# Patient Record
Sex: Male | Born: 1980
Health system: Southern US, Community
[De-identification: ages and names within clinical notes are randomized; demographics above are authoritative.]

---

## 2020-06-17 ENCOUNTER — Emergency Department (HOSPITAL_COMMUNITY): Payer: Self-pay

## 2020-06-17 ENCOUNTER — Observation Stay (HOSPITAL_COMMUNITY)
Admission: EM | Admit: 2020-06-17 | Discharge: 2020-06-18 | Disposition: A | Discharge status: Home / Self Care | Payer: Self-pay | Attending: General Surgery | Admitting: General Surgery

## 2020-06-17 ENCOUNTER — Encounter (HOSPITAL_COMMUNITY): Payer: Self-pay | Admitting: Emergency Medicine

## 2020-06-17 DIAGNOSIS — Z23 Encounter for immunization: Secondary | ICD-10-CM | POA: Insufficient documentation

## 2020-06-17 DIAGNOSIS — W3400XA Accidental discharge from unspecified firearms or gun, initial encounter: Secondary | ICD-10-CM

## 2020-06-17 DIAGNOSIS — S2241XB Multiple fractures of ribs, right side, initial encounter for open fracture: Principal | ICD-10-CM | POA: Insufficient documentation

## 2020-06-17 DIAGNOSIS — F17200 Nicotine dependence, unspecified, uncomplicated: Secondary | ICD-10-CM | POA: Insufficient documentation

## 2020-06-17 DIAGNOSIS — S27321A Contusion of lung, unilateral, initial encounter: Secondary | ICD-10-CM

## 2020-06-17 DIAGNOSIS — S21231A Puncture wound without foreign body of right back wall of thorax without penetration into thoracic cavity, initial encounter: Secondary | ICD-10-CM

## 2020-06-17 DIAGNOSIS — S21201A Unspecified open wound of right back wall of thorax without penetration into thoracic cavity, initial encounter: Secondary | ICD-10-CM | POA: Insufficient documentation

## 2020-06-17 DIAGNOSIS — Z20822 Contact with and (suspected) exposure to covid-19: Secondary | ICD-10-CM | POA: Insufficient documentation

## 2020-06-17 DIAGNOSIS — S2231XA Fracture of one rib, right side, initial encounter for closed fracture: Secondary | ICD-10-CM

## 2020-06-17 LAB — CBC
HCT: 43.6 % (ref 39.0–52.0)
Hemoglobin: 14 g/dL (ref 13.0–17.0)
MCH: 29.7 pg (ref 26.0–34.0)
MCHC: 32.1 g/dL (ref 30.0–36.0)
MCV: 92.4 fL (ref 80.0–100.0)
Platelets: 333 10*3/uL (ref 150–400)
RBC: 4.72 MIL/uL (ref 4.22–5.81)
RDW: 13.1 % (ref 11.5–15.5)
WBC: 7.3 10*3/uL (ref 4.0–10.5)
nRBC: 0 % (ref 0.0–0.2)

## 2020-06-17 LAB — COMPREHENSIVE METABOLIC PANEL
ALT: 56 U/L — ABNORMAL HIGH (ref 0–44)
AST: 33 U/L (ref 15–41)
Albumin: 3.6 g/dL (ref 3.5–5.0)
Alkaline Phosphatase: 68 U/L (ref 38–126)
Anion gap: 10 (ref 5–15)
BUN: 11 mg/dL (ref 6–20)
CO2: 23 mmol/L (ref 22–32)
Calcium: 8.7 mg/dL — ABNORMAL LOW (ref 8.9–10.3)
Chloride: 107 mmol/L (ref 98–111)
Creatinine, Ser: 1.19 mg/dL (ref 0.61–1.24)
GFR, Estimated: >60 mL/min (ref >60)
Glucose, Bld: 111 mg/dL — ABNORMAL HIGH (ref 70–99)
Potassium: 3.8 mmol/L (ref 3.5–5.1)
Sodium: 140 mmol/L (ref 135–145)
Total Bilirubin: 0.6 mg/dL (ref 0.3–1.2)
Total Protein: 6.9 g/dL (ref 6.5–8.1)

## 2020-06-17 LAB — SAMPLE TO BLOOD BANK

## 2020-06-17 LAB — RESP PANEL BY RT-PCR (FLU A&B, COVID) ARPGX2
Influenza A by PCR: NEGATIVE
Influenza B by PCR: NEGATIVE
SARS Coronavirus 2 by RT PCR: NEGATIVE

## 2020-06-17 LAB — HIV ANTIBODY (ROUTINE TESTING W REFLEX): HIV Screen 4th Generation wRfx: NONREACTIVE

## 2020-06-17 LAB — I-STAT CHEM 8, ED
BUN: 14 mg/dL (ref 6–20)
Calcium, Ion: 1.07 mmol/L — ABNORMAL LOW (ref 1.15–1.40)
Chloride: 106 mmol/L (ref 98–111)
Creatinine, Ser: 1 mg/dL (ref 0.61–1.24)
Glucose, Bld: 108 mg/dL — ABNORMAL HIGH (ref 70–99)
HCT: 43 % (ref 39.0–52.0)
Hemoglobin: 14.6 g/dL (ref 13.0–17.0)
Potassium: 3.7 mmol/L (ref 3.5–5.1)
Sodium: 142 mmol/L (ref 135–145)
TCO2: 24 mmol/L (ref 22–32)

## 2020-06-17 LAB — ETHANOL: Alcohol, Ethyl (B): <10 mg/dL (ref <10)

## 2020-06-17 LAB — LACTIC ACID, PLASMA: Lactic Acid, Venous: 1.2 mmol/L (ref 0.5–1.9)

## 2020-06-17 LAB — PROTIME-INR
INR: 1.1 (ref 0.8–1.2)
Prothrombin Time: 14.1 seconds (ref 11.4–15.2)

## 2020-06-17 MED ORDER — FENTANYL CITRATE (PF) 100 MCG/2ML IJ SOLN
50.0000 ug | Freq: Once | INTRAMUSCULAR | Status: AC
  Administered 2020-06-17: 50 ug via INTRAVENOUS
  Filled 2020-06-17: qty 2

## 2020-06-17 MED ORDER — OXYCODONE HCL 5 MG PO TABS: 5.0000 mg | ORAL_TABLET | ORAL | Status: DC | PRN

## 2020-06-17 MED ORDER — IOHEXOL 300 MG/ML  SOLN
75.0000 mL | Freq: Once | INTRAMUSCULAR | Status: AC | PRN
  Administered 2020-06-17: 75 mL via INTRAVENOUS

## 2020-06-17 MED ORDER — TETANUS-DIPHTH-ACELL PERTUSSIS 5-2.5-18.5 LF-MCG/0.5 IM SUSY
0.5000 mL | PREFILLED_SYRINGE | Freq: Once | INTRAMUSCULAR | Status: AC
  Administered 2020-06-17: 0.5 mL via INTRAMUSCULAR
  Filled 2020-06-17: qty 0.5

## 2020-06-17 MED ORDER — OXYCODONE HCL 5 MG PO TABS
10.0000 mg | ORAL_TABLET | ORAL | Status: DC | PRN
  Administered 2020-06-17: 10 mg via ORAL
  Filled 2020-06-17: qty 2

## 2020-06-17 MED ORDER — HYDROMORPHONE HCL 1 MG/ML IJ SOLN
1.0000 mg | INTRAMUSCULAR | Status: DC | PRN
  Administered 2020-06-17 – 2020-06-18 (×4): 1 mg via INTRAVENOUS
  Filled 2020-06-17 (×4): qty 1

## 2020-06-17 MED ORDER — ONDANSETRON HCL 4 MG/2ML IJ SOLN: 4.0000 mg | Freq: Four times a day (QID) | INTRAMUSCULAR | Status: DC | PRN

## 2020-06-17 MED ORDER — ENOXAPARIN SODIUM 30 MG/0.3ML ~~LOC~~ SOLN
30.0000 mg | Freq: Two times a day (BID) | SUBCUTANEOUS | Status: DC
  Administered 2020-06-18: 30 mg via SUBCUTANEOUS
  Filled 2020-06-17: qty 0.3

## 2020-06-17 MED ORDER — ONDANSETRON 4 MG PO TBDP: 4.0000 mg | ORAL_TABLET | Freq: Four times a day (QID) | ORAL | Status: DC | PRN

## 2020-06-17 MED ORDER — FENTANYL CITRATE (PF) 100 MCG/2ML IJ SOLN
INTRAMUSCULAR | Status: AC
  Administered 2020-06-17: 50 ug
  Filled 2020-06-17: qty 2

## 2020-06-17 MED ORDER — METHOCARBAMOL 750 MG PO TABS
750.0000 mg | ORAL_TABLET | Freq: Three times a day (TID) | ORAL | Status: DC
  Administered 2020-06-17 – 2020-06-18 (×3): 750 mg via ORAL
  Filled 2020-06-17 (×4): qty 1

## 2020-06-17 MED ORDER — GABAPENTIN 100 MG PO CAPS
100.0000 mg | ORAL_CAPSULE | Freq: Three times a day (TID) | ORAL | Status: DC
  Administered 2020-06-17 – 2020-06-18 (×3): 100 mg via ORAL
  Filled 2020-06-17 (×3): qty 1

## 2020-06-17 MED ORDER — POTASSIUM CHLORIDE IN NACL 20-0.9 MEQ/L-% IV SOLN
INTRAVENOUS | Status: DC
  Filled 2020-06-17: qty 1000

## 2020-06-17 MED ORDER — ACETAMINOPHEN 325 MG PO TABS: 650.0000 mg | ORAL_TABLET | ORAL | Status: DC | PRN
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
## 2020-06-18 ENCOUNTER — Other Ambulatory Visit (HOSPITAL_COMMUNITY): Payer: Self-pay | Admitting: General Surgery

## 2020-06-18 ENCOUNTER — Observation Stay (HOSPITAL_COMMUNITY): Payer: Self-pay

## 2020-06-18 MED ORDER — GUAIFENESIN ER 600 MG PO TB12
600.0000 mg | ORAL_TABLET | Freq: Two times a day (BID) | ORAL | Status: DC | PRN
Start: 2020-06-18 — End: 2020-06-18

## 2020-06-18 MED ORDER — GUAIFENESIN ER 600 MG PO TB12: 600.0000 mg | ORAL_TABLET | Freq: Two times a day (BID) | ORAL | Status: AC | PRN

## 2020-06-18 MED ORDER — IBUPROFEN 200 MG PO TABS: 600.0000 mg | ORAL_TABLET | Freq: Four times a day (QID) | ORAL | Status: AC | PRN

## 2020-06-18 MED ORDER — OXYCODONE HCL 5 MG PO TABS
5.0000 mg | ORAL_TABLET | Freq: Four times a day (QID) | ORAL | 0 refills | Status: AC | PRN
Start: 2020-06-18 — End: ?

## 2020-06-18 MED ORDER — OXYCODONE HCL 5 MG PO TABS
5.0000 mg | ORAL_TABLET | Freq: Four times a day (QID) | ORAL | 0 refills | Status: DC | PRN
Start: 2020-06-18 — End: 2020-06-18

## 2020-06-18 MED ORDER — METHOCARBAMOL 500 MG PO TABS: 1000.0000 mg | ORAL_TABLET | Freq: Three times a day (TID) | ORAL | Status: DC

## 2020-06-18 MED ORDER — ACETAMINOPHEN 500 MG PO TABS
1000.0000 mg | ORAL_TABLET | Freq: Three times a day (TID) | ORAL | Status: DC
  Administered 2020-06-18: 1000 mg via ORAL
  Filled 2020-06-18: qty 2

## 2020-06-18 MED ORDER — IBUPROFEN 200 MG PO TABS
600.0000 mg | ORAL_TABLET | Freq: Four times a day (QID) | ORAL | Status: DC | PRN
Start: 2020-06-18 — End: 2020-06-18

## 2020-06-18 MED ORDER — METHOCARBAMOL 500 MG PO TABS: 1000.0000 mg | ORAL_TABLET | Freq: Three times a day (TID) | ORAL | 0 refills | Status: AC

## 2020-06-18 MED ORDER — GABAPENTIN 100 MG PO CAPS
100.0000 mg | ORAL_CAPSULE | Freq: Three times a day (TID) | ORAL | 0 refills | Status: DC
Start: 2020-06-18 — End: 2020-06-18

## 2020-06-18 MED ORDER — BACITRACIN ZINC 500 UNIT/GM EX OINT: TOPICAL_OINTMENT | Freq: Two times a day (BID) | CUTANEOUS | 0 refills | Status: AC

## 2020-06-18 MED ORDER — IBUPROFEN 600 MG PO TABS
600.0000 mg | ORAL_TABLET | Freq: Four times a day (QID) | ORAL | Status: DC
  Administered 2020-06-18: 600 mg via ORAL
  Filled 2020-06-18: qty 1

## 2020-06-18 MED ORDER — GUAIFENESIN ER 600 MG PO TB12: 600.0000 mg | ORAL_TABLET | Freq: Two times a day (BID) | ORAL | Status: DC | PRN

## 2020-06-18 MED ORDER — ACETAMINOPHEN 500 MG PO TABS: 1000.0000 mg | ORAL_TABLET | Freq: Three times a day (TID) | ORAL | Status: AC | PRN

## 2020-06-18 MED ORDER — GABAPENTIN 100 MG PO CAPS: 100.0000 mg | ORAL_CAPSULE | Freq: Three times a day (TID) | ORAL | 0 refills | Status: AC

## 2020-06-18 MED ORDER — BACITRACIN ZINC 500 UNIT/GM EX OINT
TOPICAL_OINTMENT | Freq: Two times a day (BID) | CUTANEOUS | Status: DC
  Filled 2020-06-18: qty 28.35

## 2020-06-18 MED ORDER — ACETAMINOPHEN 500 MG PO TABS
1000.0000 mg | ORAL_TABLET | Freq: Three times a day (TID) | ORAL | Status: DC | PRN
Start: 2020-06-18 — End: 2020-06-18

## 2020-06-18 MED ORDER — BACITRACIN ZINC 500 UNIT/GM EX OINT
TOPICAL_OINTMENT | Freq: Two times a day (BID) | CUTANEOUS | 0 refills | Status: DC
Start: 2020-06-18 — End: 2020-06-18

## 2020-06-18 MED ORDER — METHOCARBAMOL 500 MG PO TABS
1000.0000 mg | ORAL_TABLET | Freq: Three times a day (TID) | ORAL | 0 refills | Status: DC
Start: 2020-06-18 — End: 2020-06-18

## 2020-06-18 MED FILL — GABAPENTIN 100 MG CAPSULE: 100 | 30 days supply | Qty: 90 | Fill #0

## 2020-06-18 MED FILL — METHOCARBAMOL 500 MG TABS: 500 | 10 days supply | Qty: 60 | Fill #0

## 2020-06-18 MED FILL — oxyCODONE HCL 5 MG TABS: 5 | 2 days supply | Qty: 15 | Fill #0
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
## 2020-06-18 NOTE — Progress Notes (Signed)
°   06/18/20 0955  Assess: MEWS Score  Temp (!) 100.8 F (38.2 C)  BP 122/62  Pulse Rate (!) 108  Resp 18  SpO2 95 %  O2 Device Room Air  Assess: MEWS Score  MEWS Temp 1  MEWS Systolic 0  MEWS Pulse 1  MEWS RR 0  MEWS LOC 0  MEWS Score 2  MEWS Score Color Yellow  Assess: if the MEWS score is Yellow or Red  Were vital signs taken at a resting state? Yes  Focused Assessment No change from prior assessment  Early Detection of Sepsis Score *See Row Information* Low  MEWS guidelines implemented *See Row Information* Yes  Treat  MEWS Interventions Administered scheduled meds/treatments  Pain Scale 0-10  Pain Score 7  Pain Type Acute pain  Pain Location Chest  Pain Orientation Right  Take Vital Signs  Increase Vital Sign Frequency  Yellow: Q 2hr X 2 then Q 4hr X 2, if remains yellow, continue Q 4hrs  Escalate  MEWS: Escalate Yellow: discuss with charge nurse/RN and consider discussing with provider and RRT  Notify: Charge Nurse/RN  Name of Charge Nurse/RN Notified Kristie, RN  Date Charge Nurse/RN Notified 06/18/20  Time Charge Nurse/RN Notified 1100  Notify: Provider  Date Provider Notified 06/18/20  Time Provider Notified 1130  Notification Type Page  Notification Reason Change in status  Response No new orders  Date of Provider Response 06/18/20

## 2020-06-18 NOTE — Progress Notes (Signed)
PT Cancellation Note  Patient Details Name: Austin [name] MRN: [id number] DOB: 06-27-1981   Cancelled Treatment:    Reason Eval/Treat Not Completed: Patient declined, no reason specified   Declined PT eval, doesn't want to move;  Noted for dc;  Was sitting EOB without difficulty, pt indicated getting up was hard, but he did it; Declined practicing when this PT offered;   Van Clines, PT  Acute Rehabilitation Services Pager [phone] Office [phone]      Levi Aland 06/18/2020, 1:48 PM
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
## 2022-01-19 ENCOUNTER — Emergency Department (HOSPITAL_COMMUNITY): Payer: Self-pay

## 2022-01-19 ENCOUNTER — Emergency Department (HOSPITAL_COMMUNITY)
Admission: EM | Admit: 2022-01-19 | Discharge: 2022-01-20 | Disposition: A | Discharge status: Home / Self Care | Payer: Self-pay | Attending: Emergency Medicine | Admitting: Emergency Medicine

## 2022-01-19 DIAGNOSIS — R4182 Altered mental status, unspecified: Secondary | ICD-10-CM | POA: Insufficient documentation

## 2022-01-19 DIAGNOSIS — T50904A Poisoning by unspecified drugs, medicaments and biological substances, undetermined, initial encounter: Secondary | ICD-10-CM | POA: Insufficient documentation

## 2022-01-19 MED ORDER — SODIUM CHLORIDE 0.9 % IV BOLUS
1000.0000 mL | Freq: Once | INTRAVENOUS | Status: AC
  Administered 2022-01-20: 1000 mL via INTRAVENOUS
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
## 2022-01-20 LAB — CBC
HCT: 37.4 % — ABNORMAL LOW (ref 39.0–52.0)
Hemoglobin: 12.8 g/dL — ABNORMAL LOW (ref 13.0–17.0)
MCH: 30.3 pg (ref 26.0–34.0)
MCHC: 34.2 g/dL (ref 30.0–36.0)
MCV: 88.4 fL (ref 80.0–100.0)
Platelets: 288 10*3/uL (ref 150–400)
RBC: 4.23 MIL/uL (ref 4.22–5.81)
RDW: 12.6 % (ref 11.5–15.5)
WBC: 5.9 10*3/uL (ref 4.0–10.5)
nRBC: 0 % (ref 0.0–0.2)

## 2022-01-20 LAB — COMPREHENSIVE METABOLIC PANEL
ALT: 34 U/L (ref 0–44)
AST: 24 U/L (ref 15–41)
Albumin: 3.6 g/dL (ref 3.5–5.0)
Alkaline Phosphatase: 70 U/L (ref 38–126)
Anion gap: 12 (ref 5–15)
BUN: 13 mg/dL (ref 6–20)
CO2: 22 mmol/L (ref 22–32)
Calcium: 8.6 mg/dL — ABNORMAL LOW (ref 8.9–10.3)
Chloride: 103 mmol/L (ref 98–111)
Creatinine, Ser: 1.13 mg/dL (ref 0.61–1.24)
GFR, Estimated: >60 mL/min (ref >60)
Glucose, Bld: 206 mg/dL — ABNORMAL HIGH (ref 70–99)
Potassium: 3.1 mmol/L — ABNORMAL LOW (ref 3.5–5.1)
Sodium: 137 mmol/L (ref 135–145)
Total Bilirubin: 0.7 mg/dL (ref 0.3–1.2)
Total Protein: 6.5 g/dL (ref 6.5–8.1)

## 2022-01-20 LAB — PROTIME-INR
INR: 1.1 (ref 0.8–1.2)
Prothrombin Time: 14.2 seconds (ref 11.4–15.2)

## 2022-01-20 LAB — LACTIC ACID, PLASMA: Lactic Acid, Venous: 1.6 mmol/L (ref 0.5–1.9)

## 2022-01-20 LAB — ACETAMINOPHEN LEVEL: Acetaminophen (Tylenol), Serum: <10 ug/mL — ABNORMAL LOW (ref 10–30)

## 2022-01-20 LAB — ETHANOL: Alcohol, Ethyl (B): <10 mg/dL (ref <10)

## 2022-01-20 LAB — SALICYLATE LEVEL: Salicylate Lvl: <7 mg/dL — ABNORMAL LOW (ref 7.0–30.0)

## 2022-01-20 NOTE — Discharge Instructions (Signed)
You were evaluated in the Emergency Department and after careful evaluation, we did not find any emergent condition requiring admission or further testing in the hospital.  Your exam/testing today is overall reassuring.  Please return to the Emergency Department if you experience any worsening of your condition.   Thank you for allowing us to be a part of your care. 

## 2022-01-20 NOTE — ED Notes (Signed)
Patient sitting up on stretcher [place] sandwich given with water , alert oriented .
